# Patient Record
Sex: Male | Born: 1965 | Race: Black or African American | Hispanic: No | Marital: Married | Smoking: Current every day smoker
Health system: Southern US, Community
[De-identification: ages and names within clinical notes are randomized; demographics above are authoritative.]

## PROBLEM LIST (undated history)

## (undated) DIAGNOSIS — J939 Pneumothorax, unspecified: Secondary | ICD-10-CM

## (undated) HISTORY — PX: CHEST TUBE INSERTION: SHX231

---

## 2014-05-19 ENCOUNTER — Encounter (HOSPITAL_COMMUNITY): Payer: Self-pay

## 2014-05-19 ENCOUNTER — Emergency Department (HOSPITAL_COMMUNITY)
Admission: EM | Admit: 2014-05-19 | Discharge: 2014-05-19 | Disposition: A | Discharge status: Home / Self Care | Payer: Self-pay | Attending: Emergency Medicine | Admitting: Emergency Medicine

## 2014-05-19 ENCOUNTER — Emergency Department (HOSPITAL_COMMUNITY): Payer: Self-pay

## 2014-05-19 DIAGNOSIS — R059 Cough, unspecified: Secondary | ICD-10-CM

## 2014-05-19 DIAGNOSIS — J4541 Moderate persistent asthma with (acute) exacerbation: Secondary | ICD-10-CM | POA: Insufficient documentation

## 2014-05-19 DIAGNOSIS — Z720 Tobacco use: Secondary | ICD-10-CM | POA: Insufficient documentation

## 2014-05-19 DIAGNOSIS — Z79899 Other long term (current) drug therapy: Secondary | ICD-10-CM | POA: Insufficient documentation

## 2014-05-19 DIAGNOSIS — R05 Cough: Secondary | ICD-10-CM

## 2014-05-19 HISTORY — DX: Pneumothorax, unspecified: J93.9

## 2014-05-19 LAB — COMPREHENSIVE METABOLIC PANEL
ALK PHOS: 71 U/L (ref 39–117)
ALT: 17 U/L (ref 0–53)
AST: 29 U/L (ref 0–37)
Albumin: 4 g/dL (ref 3.5–5.2)
Anion gap: 7 (ref 5–15)
BUN: 15 mg/dL (ref 6–23)
CALCIUM: 9.1 mg/dL (ref 8.4–10.5)
CO2: 22 mmol/L (ref 19–32)
Chloride: 108 mmol/L (ref 96–112)
Creatinine, Ser: 0.88 mg/dL (ref 0.50–1.35)
GFR calc non Af Amer: >90 mL/min (ref >90)
Glucose, Bld: 106 mg/dL — ABNORMAL HIGH (ref 70–99)
Potassium: 4.1 mmol/L (ref 3.5–5.1)
Sodium: 137 mmol/L (ref 135–145)
TOTAL PROTEIN: 6.6 g/dL (ref 6.0–8.3)
Total Bilirubin: 0.9 mg/dL (ref 0.3–1.2)

## 2014-05-19 LAB — I-STAT ARTERIAL BLOOD GAS, ED
ACID-BASE EXCESS: 1 mmol/L (ref 0.0–2.0)
Bicarbonate: 22.8 mEq/L (ref 20.0–24.0)
O2 Saturation: 100 %
PCO2 ART: 28.1 mmHg — AB (ref 35.0–45.0)
PO2 ART: 152 mmHg — AB (ref 80.0–100.0)
Patient temperature: 98.6
TCO2: 24 mmol/L (ref 0–100)
pH, Arterial: 7.517 — ABNORMAL HIGH (ref 7.350–7.450)

## 2014-05-19 LAB — CBC
HEMATOCRIT: 37.2 % — AB (ref 39.0–52.0)
HEMOGLOBIN: 12.9 g/dL — AB (ref 13.0–17.0)
MCH: 29.3 pg (ref 26.0–34.0)
MCHC: 34.7 g/dL (ref 30.0–36.0)
MCV: 84.4 fL (ref 78.0–100.0)
Platelets: 217 10*3/uL (ref 150–400)
RBC: 4.41 MIL/uL (ref 4.22–5.81)
RDW: 13.9 % (ref 11.5–15.5)
WBC: 5 10*3/uL (ref 4.0–10.5)

## 2014-05-19 MED ORDER — HYDROCOD POLST-CHLORPHEN POLST 10-8 MG/5ML PO LQCR: 5.0000 mL | Freq: Two times a day (BID) | ORAL | Status: AC | PRN

## 2014-05-19 MED ORDER — LORAZEPAM 2 MG/ML IJ SOLN
0.5000 mg | Freq: Once | INTRAMUSCULAR | Status: AC
  Administered 2014-05-19: 0.5 mg via INTRAVENOUS
  Filled 2014-05-19: qty 1

## 2014-05-19 MED ORDER — ALBUTEROL SULFATE HFA 108 (90 BASE) MCG/ACT IN AERS
2.0000 | INHALATION_SPRAY | RESPIRATORY_TRACT | Status: DC | PRN
  Filled 2014-05-19: qty 6.7

## 2014-05-19 MED ORDER — GUAIFENESIN ER 600 MG PO TB12
600.0000 mg | ORAL_TABLET | Freq: Two times a day (BID) | ORAL | Status: DC
  Administered 2014-05-19: 600 mg via ORAL
  Filled 2014-05-19 (×3): qty 1

## 2014-05-19 MED ORDER — ALBUTEROL SULFATE (2.5 MG/3ML) 0.083% IN NEBU
5.0000 mg | INHALATION_SOLUTION | Freq: Once | RESPIRATORY_TRACT | Status: AC
  Administered 2014-05-19: 5 mg via RESPIRATORY_TRACT
  Filled 2014-05-19: qty 6

## 2014-05-19 MED ORDER — PREDNISONE 10 MG PO TABS: 20.0000 mg | ORAL_TABLET | Freq: Every day | ORAL | Status: AC

## 2014-05-19 MED ORDER — SODIUM CHLORIDE 0.9 % IV BOLUS (SEPSIS)
500.0000 mL | Freq: Once | INTRAVENOUS | Status: AC
  Administered 2014-05-19: 500 mL via INTRAVENOUS

## 2014-05-19 MED ORDER — METHYLPREDNISOLONE SODIUM SUCC 125 MG IJ SOLR
125.0000 mg | Freq: Once | INTRAMUSCULAR | Status: AC
  Administered 2014-05-19: 125 mg via INTRAVENOUS
  Filled 2014-05-19: qty 2

## 2014-05-19 NOTE — ED Notes (Signed)
Pt has had cough and congestion X1 week, increased shortness of breath that began today. Pt has hx of collapsed lung requiring chest tube X3. Mild distress noted, O2 stable.

## 2014-05-19 NOTE — ED Provider Notes (Signed)
CSN: [id number]     Arrival date & time 05/19/14  1045 History   First MD Initiated Contact with Patient 05/19/14 1047     Chief Complaint  Patient presents with  . Shortness of Breath     (Consider location/radiation/quality/duration/timing/severity/associated sxs/prior Treatment) HPI   49 year old male with history of pneumothorax who presents today complaining of one-week history of coughing. His cough has been nonproductive. He has had increasing dyspnea. Pain at the left lateral chest area. It is sharp in nature. He is a smoker but states he has not been able to smoke the past 3 days. He is able to work until yesterday. Today he attempts to go to work but was too dyspneic and was unable to work. His wife brought him in secondary to this. He has an albuterol inhaler at home which he has been using without relief. He has been taking by mouth without nausea vomiting or diarrhea. He denies any headache, head injury, visual change, abdominal pain, swelling in the legs, history of PE or DVT. He is not currently taking any medications.  Past Medical History  Diagnosis Date  . Pneumothorax    Past Surgical History  Procedure Laterality Date  . Chest tube insertion     No family history on file. History  Substance Use Topics  . Smoking status: Current Every Day Smoker  . Smokeless tobacco: Not on file  . Alcohol Use: Not on file    Review of Systems  All other systems reviewed and are negative.     Allergies  Review of patient's allergies indicates no known allergies.  Home Medications   Prior to Admission medications   Medication Sig Start Date End Date Taking? Authorizing Provider  albuterol (PROVENTIL HFA;VENTOLIN HFA) 108 (90 BASE) MCG/ACT inhaler Inhale 1-2 puffs into the lungs every 6 (six) hours as needed for wheezing or shortness of breath.   Yes Historical Provider, MD   BP 179/102 mmHg  Pulse 65  Temp(Src) 97.8 F (36.6 C) (Oral)  Resp 19  SpO2 100% Physical  Exam  Constitutional: He is oriented to person, place, and time. He appears well-developed and well-nourished. He appears distressed.  Thin  HENT:  Head: Normocephalic and atraumatic.  Right Ear: External ear normal.  Left Ear: External ear normal.  Nose: Nose normal.  Mouth/Throat: Oropharynx is clear and moist.  Eyes: Conjunctivae and EOM are normal. Pupils are equal, round, and reactive to light.  Neck: Normal range of motion. Neck supple.  Cardiovascular: Normal rate, regular rhythm, normal heart sounds and intact distal pulses.   Pulmonary/Chest: He is in respiratory distress. He has no wheezes. He has rales. He exhibits no tenderness.  Abdominal: Soft. Bowel sounds are normal.  Musculoskeletal: Normal range of motion. He exhibits no edema or tenderness.  Neurological: He is alert and oriented to person, place, and time. He has normal reflexes. He displays normal reflexes. No cranial nerve deficit. He exhibits normal muscle tone. Coordination normal.  Skin: Skin is warm and dry.  Psychiatric: He has a normal mood and affect. His behavior is normal. Judgment and thought content normal.  Nursing note and vitals reviewed.   ED Course  Procedures (including critical care time) Labs Review Labs Reviewed  CBC - Abnormal; Notable for the following:    Hemoglobin 12.9 (*)    HCT 37.2 (*)    All other components within normal limits  COMPREHENSIVE METABOLIC PANEL - Abnormal; Notable for the following:    Glucose, Bld 106 (*)  All other components within normal limits  I-STAT ARTERIAL BLOOD GAS, ED - Abnormal; Notable for the following:    pH, Arterial 7.517 (*)    pCO2 arterial 28.1 (*)    pO2, Arterial 152.0 (*)    All other components within normal limits  BLOOD GAS, ARTERIAL    Imaging Review Dg Chest Port 1 View  05/19/2014   CLINICAL DATA:  Shortness of breath and wheezing for 4 days  EXAM: PORTABLE CHEST - 1 VIEW  COMPARISON:  None.  FINDINGS: The heart size and  mediastinal contours are within normal limits. Both lungs are clear. The visualized skeletal structures are unremarkable.  IMPRESSION: No active disease.   Electronically Signed   By: Elige Ko   On: 05/19/2014 11:22     EKG Interpretation   Date/Time:  Friday May 19 2014 11:14:59 EST Ventricular Rate:  65 PR Interval:  222 QRS Duration: 85 QT Interval:  372 QTC Calculation: 387 R Axis:   93 Text Interpretation:  Sinus rhythm Prolonged PR interval Borderline right  axis deviation Consider left ventricular hypertrophy ST elev, probable  normal early repol pattern Confirmed by Tannah Dreyfuss MD, Andriy Sherk ([id number]) on  05/19/2014 11:27:41 AM      MDM   Final diagnoses:  Cough  Asthmatic bronchitis, moderate persistent, with acute exacerbation    Patient with cugh and dyspnea.  No focal infiltrate.  Patient with clearing of rhonchi after one neb although some radiating stridor still heard.  Patient given mucinex as it sounds as though he is having trouble clearing upper respiratory secretions.  ABG, remainder of labs and vitals without s/s of acute distress or condition requiring intervention.  Plan prednisone and albuterol with return precautions and need for follow up discussed and understood.     Hilario Quarry, MD 05/19/14 [id number]

## 2015-08-04 IMAGING — CR DG CHEST 1V PORT
1 series · 1 of 1 positions shown · non-contrast
Comparison: None.

CLINICAL DATA: Shortness of breath and wheezing for 4 days

EXAM:
PORTABLE CHEST - 1 VIEW

[AP]
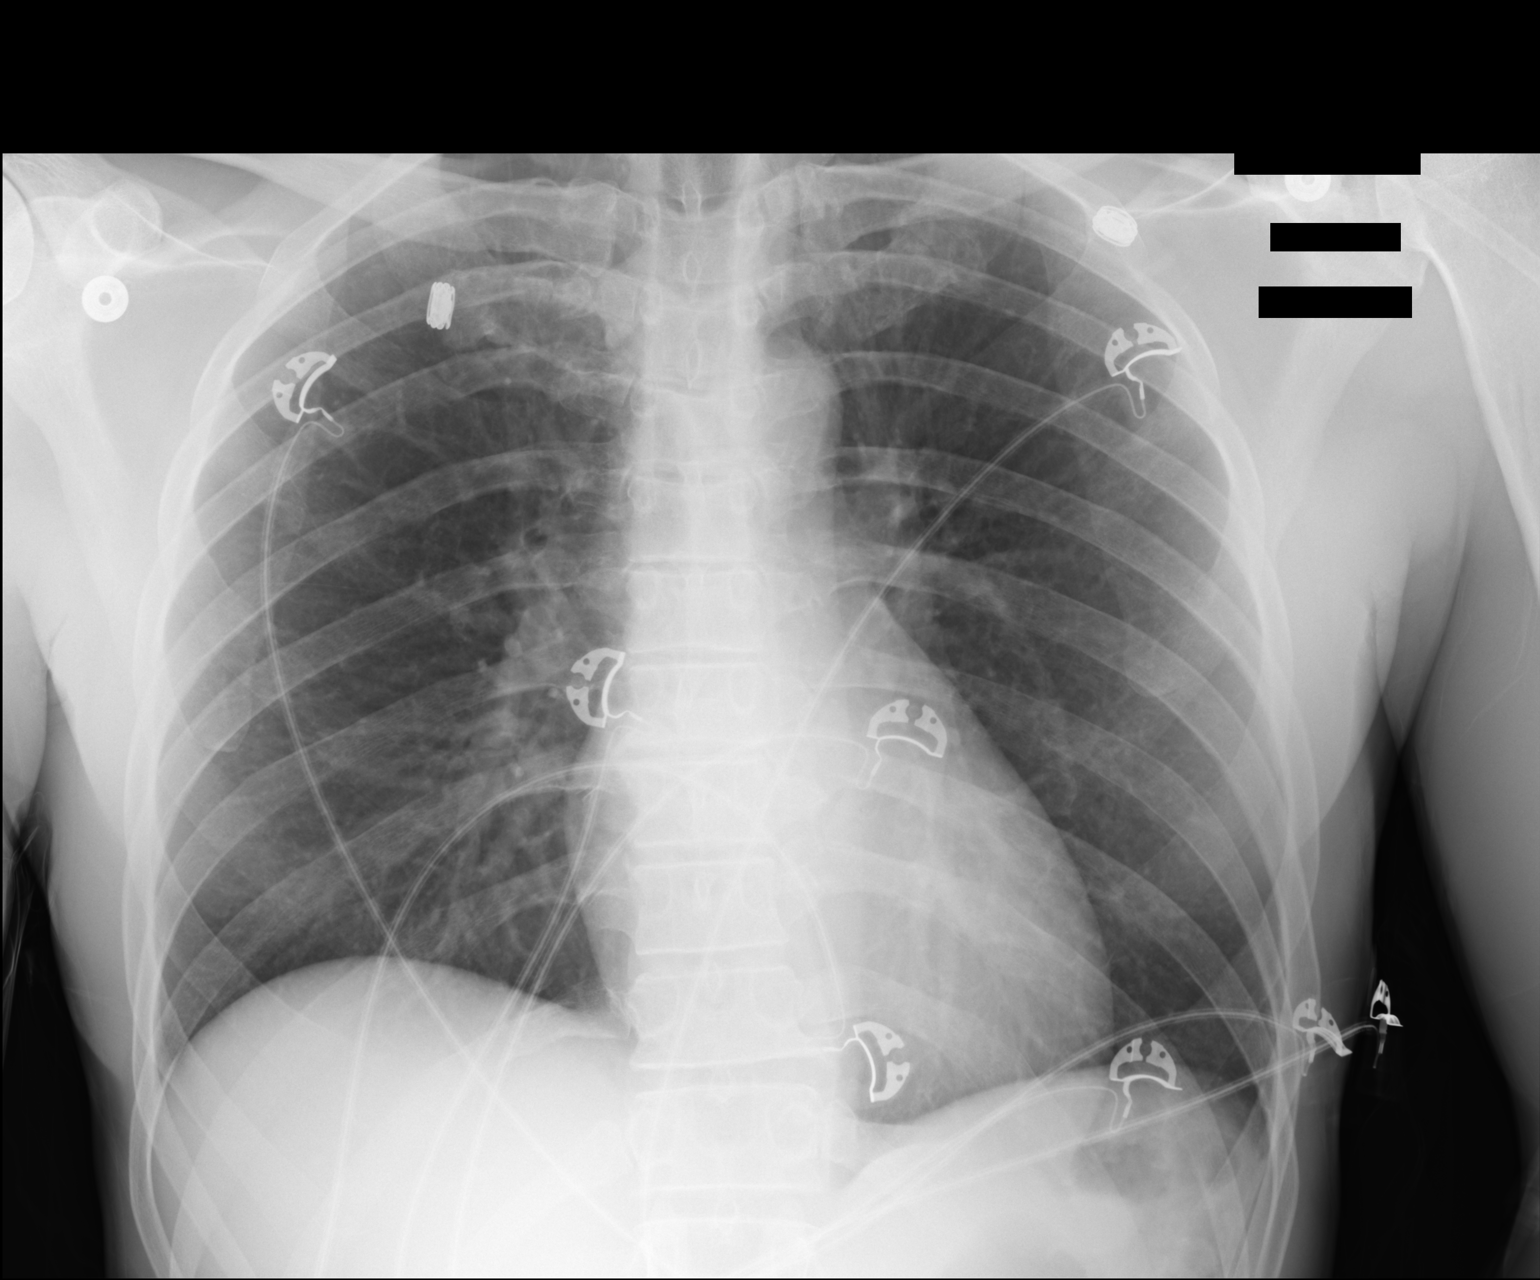

[1 of 1 positions shown; findings below may reference images not displayed]

FINDINGS: The heart size and mediastinal contours are within normal limits.
Both lungs are clear. The visualized skeletal structures are
unremarkable.
IMPRESSION: No active disease.
